# Patient Record
Sex: Male | Born: 1970 | Race: Black or African American | Hispanic: No | Marital: Married | Smoking: Current every day smoker
Health system: Southern US, Community
[De-identification: ages and names within clinical notes are randomized; demographics above are authoritative.]

---

## 2002-06-01 ENCOUNTER — Emergency Department (HOSPITAL_COMMUNITY): Admission: EM | Admit: 2002-06-01 | Discharge: 2002-06-01 | Payer: Self-pay | Admitting: Emergency Medicine

## 2003-07-29 ENCOUNTER — Emergency Department (HOSPITAL_COMMUNITY): Admission: EM | Admit: 2003-07-29 | Discharge: 2003-07-29 | Payer: Self-pay | Admitting: Emergency Medicine

## 2004-09-06 IMAGING — CR DG CHEST 2V
2 series · 2 of 2 positions shown · non-contrast
Comparison: none

CLINICAL DATA: Chest pain. 
 TWO VIEW CHEST
 No priors for comparison. 
 The heart size and mediastinal contours are normal. The lungs are clear. The visualized skeleton is unremarkable.

 IMPRESSION
 No active disease.

[view not recorded (1 of 2)]
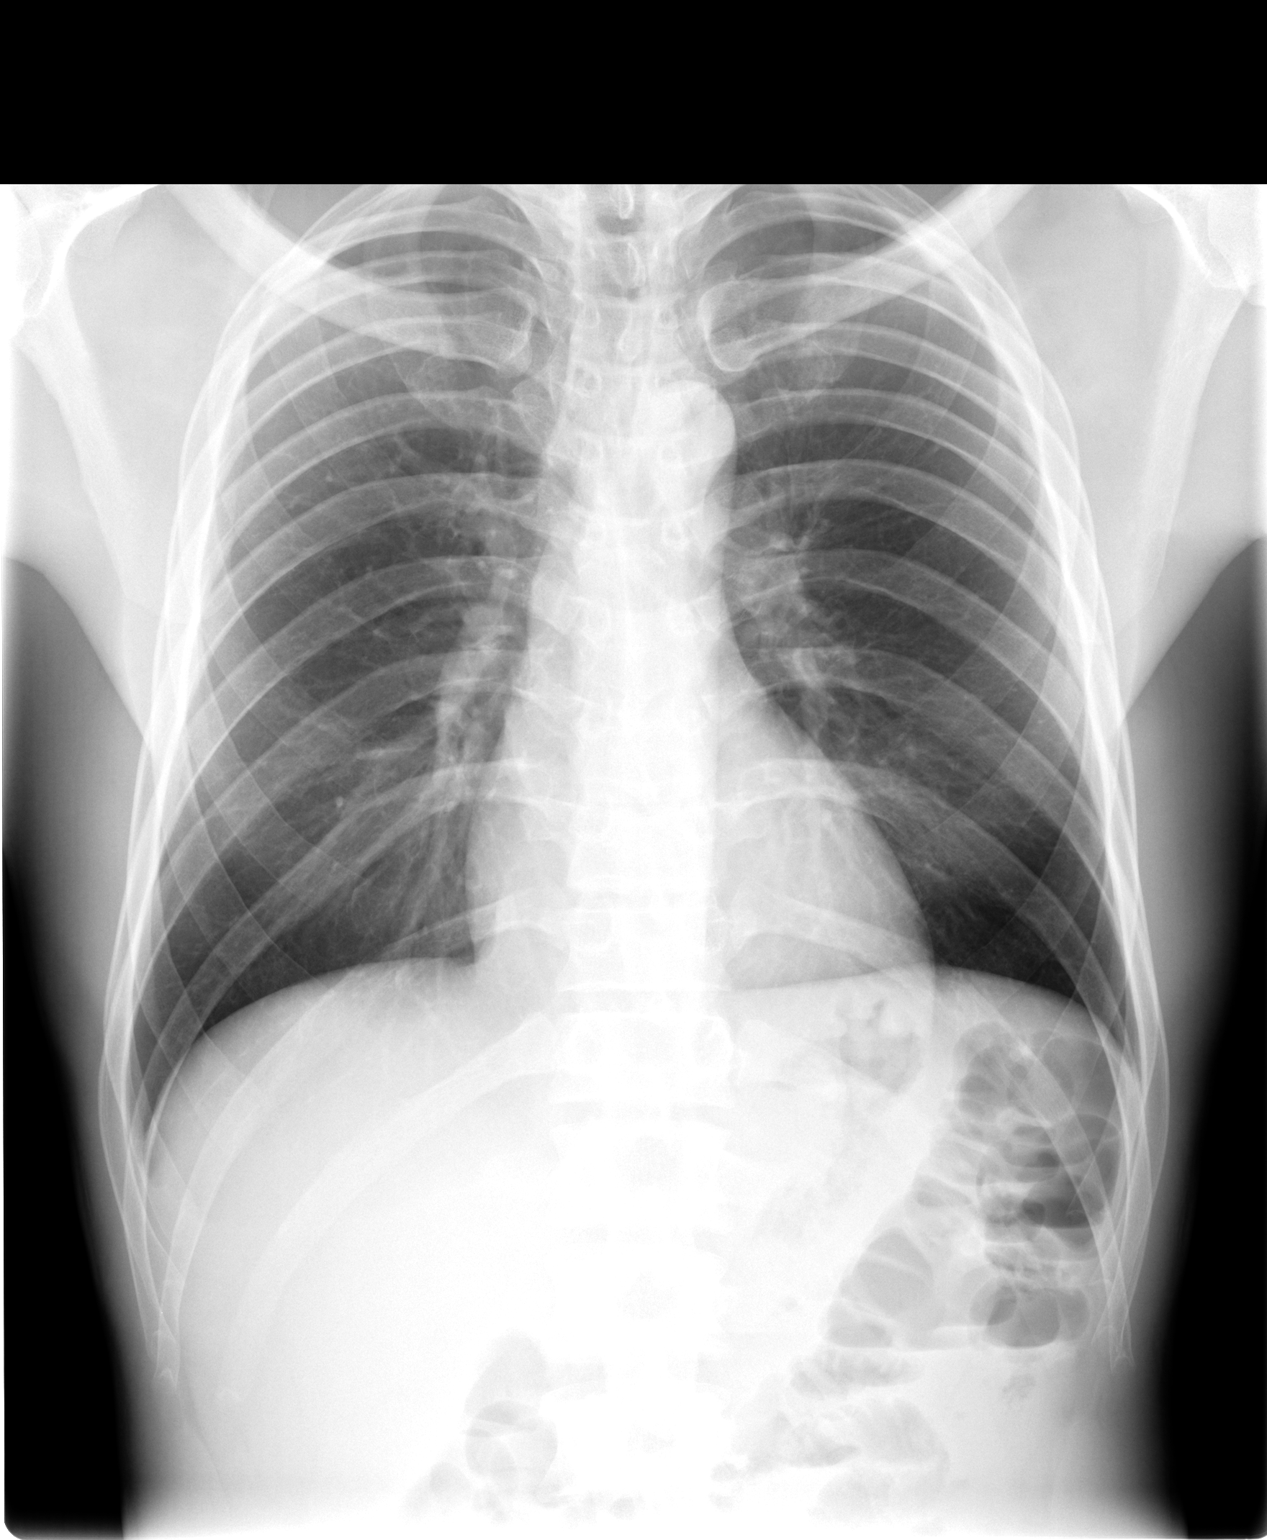

[view not recorded (2 of 2)]
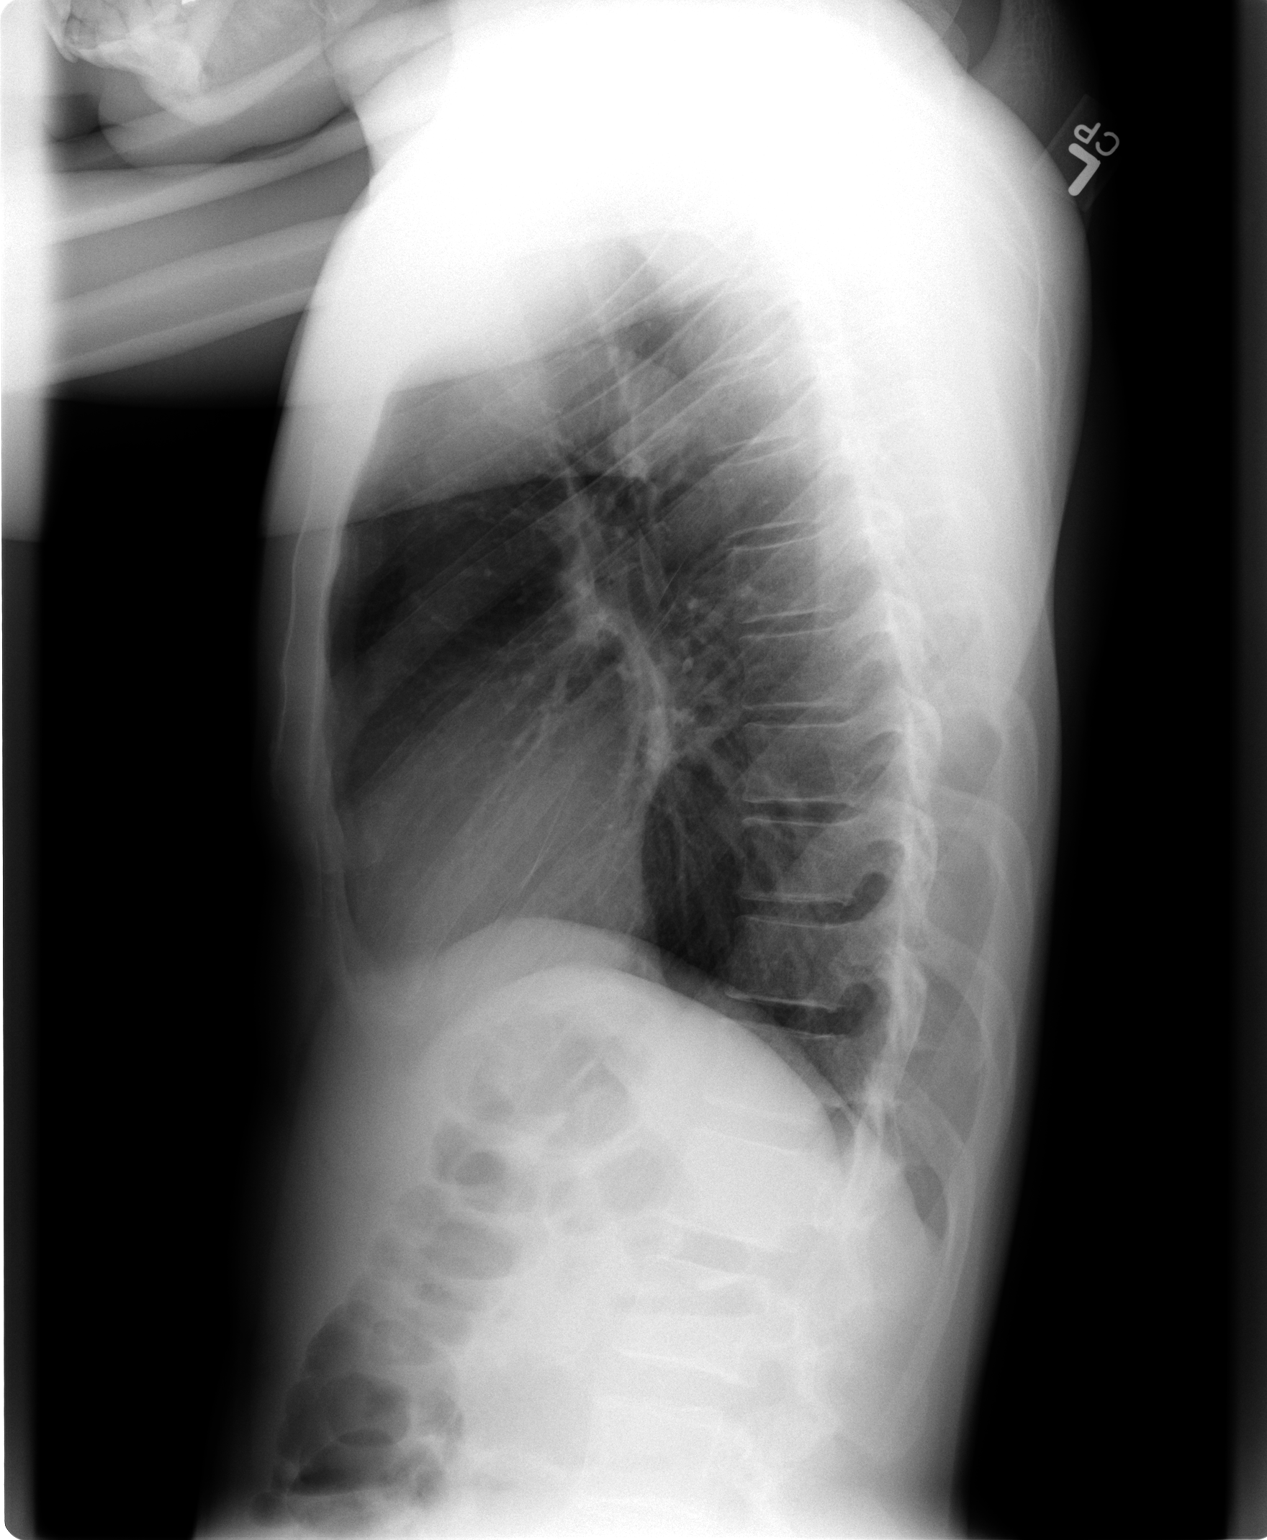

[2 of 2 positions shown; findings below may reference images not displayed]

## 2019-01-28 ENCOUNTER — Telehealth: Payer: Self-pay | Admitting: *Deleted

## 2019-01-28 ENCOUNTER — Encounter (HOSPITAL_COMMUNITY): Payer: Self-pay | Admitting: Emergency Medicine

## 2019-01-28 ENCOUNTER — Emergency Department (HOSPITAL_COMMUNITY)
Admission: EM | Admit: 2019-01-28 | Discharge: 2019-01-28 | Disposition: A | Discharge status: Home / Self Care | Payer: BC Managed Care – PPO | Attending: Emergency Medicine | Admitting: Emergency Medicine

## 2019-01-28 ENCOUNTER — Other Ambulatory Visit: Payer: Self-pay

## 2019-01-28 ENCOUNTER — Emergency Department (HOSPITAL_COMMUNITY): Payer: BC Managed Care – PPO

## 2019-01-28 ENCOUNTER — Encounter (INDEPENDENT_AMBULATORY_CARE_PROVIDER_SITE_OTHER): Payer: Self-pay

## 2019-01-28 ENCOUNTER — Telehealth: Payer: BC Managed Care – PPO | Admitting: Nurse Practitioner

## 2019-01-28 DIAGNOSIS — Z20828 Contact with and (suspected) exposure to other viral communicable diseases: Secondary | ICD-10-CM | POA: Insufficient documentation

## 2019-01-28 DIAGNOSIS — R52 Pain, unspecified: Secondary | ICD-10-CM | POA: Diagnosis not present

## 2019-01-28 DIAGNOSIS — F17210 Nicotine dependence, cigarettes, uncomplicated: Secondary | ICD-10-CM | POA: Diagnosis not present

## 2019-01-28 DIAGNOSIS — R5383 Other fatigue: Secondary | ICD-10-CM | POA: Insufficient documentation

## 2019-01-28 DIAGNOSIS — R42 Dizziness and giddiness: Secondary | ICD-10-CM

## 2019-01-28 DIAGNOSIS — Z20822 Contact with and (suspected) exposure to covid-19: Secondary | ICD-10-CM

## 2019-01-28 LAB — CBC WITH DIFFERENTIAL/PLATELET
Abs Immature Granulocytes: 0.03 10*3/uL (ref 0.00–0.07)
Basophils Absolute: 0 10*3/uL (ref 0.0–0.1)
Basophils Relative: 0 %
Eosinophils Absolute: 0.4 10*3/uL (ref 0.0–0.5)
Eosinophils Relative: 5 %
HCT: 49.2 % (ref 39.0–52.0)
Hemoglobin: 17 g/dL (ref 13.0–17.0)
Immature Granulocytes: 0 %
Lymphocytes Relative: 32 %
Lymphs Abs: 2.8 10*3/uL (ref 0.7–4.0)
MCH: 29.5 pg (ref 26.0–34.0)
MCHC: 34.6 g/dL (ref 30.0–36.0)
MCV: 85.4 fL (ref 80.0–100.0)
Monocytes Absolute: 0.7 10*3/uL (ref 0.1–1.0)
Monocytes Relative: 8 %
Neutro Abs: 4.7 10*3/uL (ref 1.7–7.7)
Neutrophils Relative %: 55 %
Platelets: 260 10*3/uL (ref 150–400)
RBC: 5.76 MIL/uL (ref 4.22–5.81)
RDW: 13.2 % (ref 11.5–15.5)
WBC: 8.7 10*3/uL (ref 4.0–10.5)
nRBC: 0 % (ref 0.0–0.2)

## 2019-01-28 LAB — CBG MONITORING, ED: Glucose-Capillary: 94 mg/dL (ref 70–99)

## 2019-01-28 LAB — COMPREHENSIVE METABOLIC PANEL
ALT: 26 U/L (ref 0–44)
AST: 24 U/L (ref 15–41)
Albumin: 4.2 g/dL (ref 3.5–5.0)
Alkaline Phosphatase: 45 U/L (ref 38–126)
Anion gap: 10 (ref 5–15)
BUN: 10 mg/dL (ref 6–20)
CO2: 21 mmol/L — ABNORMAL LOW (ref 22–32)
Calcium: 9.4 mg/dL (ref 8.9–10.3)
Chloride: 105 mmol/L (ref 98–111)
Creatinine, Ser: 1.12 mg/dL (ref 0.61–1.24)
GFR calc Af Amer: >60 mL/min (ref >60)
GFR calc non Af Amer: >60 mL/min (ref >60)
Glucose, Bld: 93 mg/dL (ref 70–99)
Potassium: 4 mmol/L (ref 3.5–5.1)
Sodium: 136 mmol/L (ref 135–145)
Total Bilirubin: 2.2 mg/dL — ABNORMAL HIGH (ref 0.3–1.2)
Total Protein: 6.7 g/dL (ref 6.5–8.1)

## 2019-01-28 NOTE — Telephone Encounter (Signed)
Left message for pt to CB. Received BPA for worsening appetite via MyChart Covid symptom monitoring. Will also respond in [place] [facility].

## 2019-01-28 NOTE — ED Provider Notes (Signed)
[facility] EMERGENCY DEPARTMENT Provider Note   CSN: [id number] Arrival date & time: 01/28/19  1859     History   Chief Complaint Chief Complaint  Patient presents with   Muscle Aches/ Dizziness/Poor Appetite    HPI Gregorey Nabor is a 48 y.o. male.     The history is provided by the patient and medical records.     48 year old male presenting to the ED with fatigue, body aches, and some mild dizziness, onset this morning.  States he feels very tired and rundown.  He has been working 10 to 14-hour days for the past few weeks.  He did start having a cough this morning but denies any hemoptysis, chest pain, shortness of breath, or fever.  He has not had any known COVID exposures but states he does work with the public so he would not be surprised.  History reviewed. No pertinent past medical history.  There are no active problems to display for this patient.   History reviewed. No pertinent surgical history.      Home Medications    Prior to Admission medications   Not on File    Family History No family history on file.  Social History Social History   Tobacco Use   Smoking status: Current Every Day Smoker   Smokeless tobacco: Never Used  Substance Use Topics   Alcohol use: Yes    Frequency: Never   Drug use: Never     Allergies   Patient has no known allergies.   Review of Systems Review of Systems  Constitutional: Positive for fatigue.  Respiratory: Positive for cough.   Neurological: Positive for dizziness.  All other systems reviewed and are negative.    Physical Exam Updated Vital Signs BP 136/87    Pulse (!) 54    Temp 98 F (36.7 C) (Oral)    Resp 16    SpO2 99%   Physical Exam Vitals signs and nursing note reviewed.  Constitutional:      Appearance: He is well-developed.     Comments: Appears well, working on laptop   HENT:     Head: Normocephalic and atraumatic.  Eyes:     Conjunctiva/sclera:  Conjunctivae normal.     Pupils: Pupils are equal, round, and reactive to light.  Neck:     Musculoskeletal: Normal range of motion.  Cardiovascular:     Rate and Rhythm: Normal rate and regular rhythm.     Heart sounds: Normal heart sounds.  Pulmonary:     Effort: Pulmonary effort is normal.     Breath sounds: Normal breath sounds. No wheezing or rhonchi.     Comments: Lungs clear, no distress, speaking in full sentences without difficulty, O2 sats 100% on RA Abdominal:     General: Bowel sounds are normal.     Palpations: Abdomen is soft.  Musculoskeletal: Normal range of motion.  Skin:    General: Skin is warm and dry.  Neurological:     Mental Status: He is alert and oriented to person, place, and time.      ED Treatments / Results  Labs (all labs ordered are listed, but only abnormal results are displayed) Labs Reviewed  COMPREHENSIVE METABOLIC PANEL - Abnormal; Notable for the following components:      Result Value   CO2 21 (*)    Total Bilirubin 2.2 (*)    All other components within normal limits  NOVEL CORONAVIRUS, NAA (HOSP ORDER, SEND-OUT TO REF LAB; TAT 18-24 HRS)  CBC WITH DIFFERENTIAL/PLATELET  CBG MONITORING, ED    EKG None  Radiology Dg Chest Port 1 View  Result Date: 01/28/2019 CLINICAL DATA:  Cough, body aches, possible COVID-19 19 EXAM: PORTABLE CHEST 1 VIEW COMPARISON:  None. FINDINGS: No consolidation, features of edema, pneumothorax, or effusion. Pulmonary vascularity is normally distributed. The cardiomediastinal contours are unremarkable. No acute osseous or soft tissue abnormality. Enthesopathic spur along the undersurface of the distal left clavicle. Mild widening of the left acromioclavicular joint could reflect prior shoulder injury. IMPRESSION: No acute cardiopulmonary abnormality. Asymmetric widening of the left acromioclavicular joint, correlate for history of prior injury tenderness to Electronically Signed   By: Kreg Shropshire M.D.   On:  01/28/2019 23:40    Procedures Procedures (including critical care time)  Medications Ordered in ED Medications - No data to display   Initial Impression / Assessment and Plan / ED Course  I have reviewed the triage vital signs and the nursing notes.  Pertinent labs & imaging results that were available during my care of the patient were reviewed by me and considered in my medical decision making (see chart for details).  48 year old male presenting to the ED with fatigue, body aches, and mild dizziness since this morning.  He is concerned for possible COVID.  He does work with the public so might of had an exposure but denies any known exposures.  He is afebrile and nontoxic.  He is in no acute distress.  His vitals are stable on room air.  His lungs are clear without any noted wheezes or rhonchi. He is not have any focal neurologic deficits.  EKG and screening labs from triage are reassuring.  Portable CXR is clear, does have widening of left AC joint.  Patient reports this is an old injury from years ago. Patient is hemodynamically stable here, not requiring supplemental oxygen and does not appear to need further emergent work-up or admission.  He will have outpatient COVID test sent.  He was instructed to home quarantine until results return, an additional 2 weeks if positive.  He was given copy of CDC guidelines for review.  Encouraged Tylenol for fever and/or body aches.  Follow-up with PCP.  He will return here for any new or acute changes, specifically any development of shortness of breath or chest pain.  Final Clinical Impressions(s) / ED Diagnoses   Final diagnoses:  Other fatigue  Body aches    ED Discharge Orders    None       Garlon Hatchet, PA-C 01/28/19 2348    Nira Conn, MD 01/29/19 [phone]

## 2019-01-28 NOTE — Progress Notes (Signed)
E-Visit for Corona Virus Screening   Your current symptoms could be consistent with the coronavirus. The new CDC guidelines say that if you have minor symptoms you do not need to be tested just quarantine until 72 hours with no symptoms. It is up to you wether you would like to be tested or not. Or you may need to be tested per your employer if you have any symptoms. The following will give you guidelines about being tested.   Many health care providers can now test patients at their office but not all are.  [place] has multiple testing sites. For information on our COVID testing locations and hours go to [email or web address]  Please quarantine yourself while awaiting your test results.  We are enrolling you in our MyChart Home [place] for COVID19 . Daily you will receive a questionnaire within the MyChart website. Our COVID 19 response team willl be monitoriing your responses daily.  You can go to one of the  testing sites listed below, while they are opened (see hours). You do not need a doctors order to be tested for covid.You do need to self-isolate until your results return and if positive 14 days from when your symptoms started and until you are 3 days symptom free.   Testing Locations (Monday - Friday, 8 a.m. - 3:30 p.m.) . [place]: [facility], [address], [place], [place]  . [place]: [address], [place], [place] (entrance off [employer])  . [facility]: [address] [place], [place] (across from [facility])    COVID-19 is a respiratory illness with symptoms that are similar to the flu. Symptoms are typically mild to moderate, but there have been cases of severe illness and death due to the virus. The following symptoms may appear 2-14 days after exposure: . Fever . Cough . Shortness of breath or difficulty breathing . Chills . Repeated  shaking with chills . Muscle pain . Headache . Sore throat . New loss of taste or smell . Fatigue . Congestion or runny nose . Nausea or vomiting . Diarrhea  It is vitally important that if you feel that you have an infection such as this virus or any other virus that you stay home and away from places where you may spread it to others.  You should self-quarantine for 14 days if you have symptoms that could potentially be coronavirus or have been in close contact a with a person diagnosed with COVID-19 within the last 2 weeks. You should avoid contact with people age 47 and older.   You should wear a mask or cloth face covering over your nose and mouth if you must be around other people or animals, including pets (even at home). Try to stay at least 6 feet away from other people. This will protect the people around you.  You can use medication such as delsym or mucinex if develop a constant cough  You may also take acetaminophen (Tylenol) as needed for fever.   Reduce your risk of any infection by using the same precautions used for avoiding the common cold or flu:  Marland Kitchen Wash your hands often with soap and warm water for at least 20 seconds.  If soap and water are not readily available, use an alcohol-based hand sanitizer with at least 60% alcohol.  . If coughing or sneezing, cover your mouth and nose by coughing or sneezing into the elbow areas of your  shirt or coat, into a tissue or into your sleeve (not your hands). . Avoid shaking hands with others and consider head nods or verbal greetings only. . Avoid touching your eyes, nose, or mouth with unwashed hands.  . Avoid close contact with people who are sick. . Avoid places or events with large numbers of people in one location, like concerts or sporting events. . Carefully consider travel plans you have or are making. . If you are planning any travel outside or inside the [place], visit the CDC's Travelers' Health webpage for the latest health  notices. . If you have some symptoms but not all symptoms, continue to monitor at home and seek medical attention if your symptoms worsen. . If you are having a medical emergency, call 911.  HOME CARE . Only take medications as instructed by your medical team. . Drink plenty of fluids and get plenty of rest. . A steam or ultrasonic humidifier can help if you have congestion.   GET HELP RIGHT AWAY IF YOU HAVE EMERGENCY WARNING SIGNS** FOR COVID-19. If you or someone is showing any of these signs seek emergency medical care immediately. Call 911 or proceed to your closest emergency facility if: . You develop worsening high fever. . Trouble breathing . Bluish lips or face . Persistent pain or pressure in the chest . New confusion . Inability to wake or stay awake . You cough up blood. . Your symptoms become more severe  **This list is not all possible symptoms. Contact your medical provider for any symptoms that are sever or concerning to you.   MAKE SURE YOU   Understand these instructions.  Will watch your condition.  Will get help right away if you are not doing well or get worse.  Your e-visit answers were reviewed by a board certified advanced clinical practitioner to complete your personal care plan.  Depending on the condition, your plan could have included both over the counter or prescription medications.  If there is a problem please reply once you have received a response from your provider.  Your safety is important to [place].  If you have drug allergies check your prescription carefully.    You can use MyChart to ask questions about today's visit, request a non-urgent call back, or ask for a work or school excuse for 24 hours related to this e-Visit. If it has been greater than 24 hours you will need to follow up with your provider, or enter a new e-Visit to address those concerns. You will get an e-mail in the next two days asking about your experience.  I hope that your  e-visit has been valuable and will speed your recovery. Thank you for using e-visits.   5-10 minutes spent reviewing and documenting in chart.

## 2019-01-28 NOTE — Discharge Instructions (Signed)
COVID test should return within the next 24 hours or so.  You will be contacted with results, they will also be uploaded into your MyChart. Recommend tylenol for fever and/or body aches.  Try to rest and stay hydrated. You will need to quarantine until results for COVID test are back, and additional 2 weeks if positive.  See full CDC guidelines below. Return here for any new/acute changes, specifically any chest pain, coughing up blood, shortness of breath, etc.     Person Under Monitoring Name: Ronald Moyer  Location: [address] [place] [address]   Infection Prevention Recommendations for Individuals Confirmed to have, or Being Evaluated for, 2019 Novel Coronavirus (COVID-19) Infection Who Receive Care at Home  Individuals who are confirmed to have, or are being evaluated for, COVID-19 should follow the prevention steps below until a healthcare provider or local or state health department says they can return to normal activities.  Stay home except to get medical care You should restrict activities outside your home, except for getting medical care. Do not go to work, school, or public areas, and do not use public transportation or taxis.  Call ahead before visiting your doctor Before your medical appointment, call the healthcare provider and tell them that you have, or are being evaluated for, COVID-19 infection. This will help the healthcare providers office take steps to keep other people from getting infected. Ask your healthcare provider to call the local or state health department.  Monitor your symptoms Seek prompt medical attention if your illness is worsening (e.g., difficulty breathing). Before going to your medical appointment, call the healthcare provider and tell them that you have, or are being evaluated for, COVID-19 infection. Ask your healthcare provider to call the local or state health department.  Wear a facemask You should wear a facemask  that covers your nose and mouth when you are in the same room with other people and when you visit a healthcare provider. People who live with or visit you should also wear a facemask while they are in the same room with you.  Separate yourself from other people in your home As much as possible, you should stay in a different room from other people in your home. Also, you should use a separate bathroom, if available.  Avoid sharing household items You should not share dishes, drinking glasses, cups, eating utensils, towels, bedding, or other items with other people in your home. After using these items, you should wash them thoroughly with soap and water.  Cover your coughs and sneezes Cover your mouth and nose with a tissue when you cough or sneeze, or you can cough or sneeze into your sleeve. Throw used tissues in a lined trash can, and immediately wash your hands with soap and water for at least 20 seconds or use an alcohol-based hand rub.  Wash your [facility] your hands often and thoroughly with soap and water for at least 20 seconds. You can use an alcohol-based hand sanitizer if soap and water are not available and if your hands are not visibly dirty. Avoid touching your eyes, nose, and mouth with unwashed hands.   Prevention Steps for Caregivers and Household Members of Individuals Confirmed to have, or Being Evaluated for, COVID-19 Infection Being Cared for in the Home  If you live with, or provide care at home for, a person confirmed to have, or being evaluated for, COVID-19 infection please follow these guidelines to prevent infection:  Follow healthcare providers instructions  Make sure that you understand and can help the patient follow any healthcare provider instructions for all care.  Provide for the patients basic needs You should help the patient with basic needs in the home and provide support for getting groceries, prescriptions, and other personal  needs.  Monitor the patients symptoms If they are getting sicker, call his or her medical provider and tell them that the patient has, or is being evaluated for, COVID-19 infection. This will help the healthcare providers office take steps to keep other people from getting infected. Ask the healthcare provider to call the local or state health department.  Limit the number of people who have contact with the patient If possible, have only one caregiver for the patient. Other household members should stay in another home or place of residence. If this is not possible, they should stay in another room, or be separated from the patient as much as possible. Use a separate bathroom, if available. Restrict visitors who do not have an essential need to be in the home.  Keep older adults, very young children, and other sick people away from the patient Keep older adults, very young children, and those who have compromised immune systems or chronic health conditions away from the patient. This includes people with chronic heart, lung, or kidney conditions, diabetes, and cancer.  Ensure good ventilation Make sure that shared spaces in the home have good air flow, such as from an air conditioner or an opened window, weather permitting.  Wash your hands often Wash your hands often and thoroughly with soap and water for at least 20 seconds. You can use an alcohol based hand sanitizer if soap and water are not available and if your hands are not visibly dirty. Avoid touching your eyes, nose, and mouth with unwashed hands. Use disposable paper towels to dry your hands. If not available, use dedicated cloth towels and replace them when they become wet.  Wear a facemask and gloves Wear a disposable facemask at all times in the room and gloves when you touch or have contact with the patients blood, body fluids, and/or secretions or excretions, such as sweat, saliva, sputum, nasal mucus, vomit, urine, or  feces.  Ensure the mask fits over your nose and mouth tightly, and do not touch it during use. Throw out disposable facemasks and gloves after using them. Do not reuse. Wash your hands immediately after removing your facemask and gloves. If your personal clothing becomes contaminated, carefully remove clothing and launder. Wash your hands after handling contaminated clothing. Place all used disposable facemasks, gloves, and other waste in a lined container before disposing them with other household waste. Remove gloves and wash your hands immediately after handling these items.  Do not share dishes, glasses, or other household items with the patient Avoid sharing household items. You should not share dishes, drinking glasses, cups, eating utensils, towels, bedding, or other items with a patient who is confirmed to have, or being evaluated for, COVID-19 infection. After the person uses these items, you should wash them thoroughly with soap and water.  Wash laundry thoroughly Immediately remove and wash clothes or bedding that have blood, body fluids, and/or secretions or excretions, such as sweat, saliva, sputum, nasal mucus, vomit, urine, or feces, on them. Wear gloves when handling laundry from the patient. Read and follow directions on labels of laundry or clothing items and detergent. In general, wash and dry with the warmest temperatures recommended on the label.  Clean all areas the  individual has used often Clean all touchable surfaces, such as counters, tabletops, doorknobs, bathroom fixtures, toilets, phones, keyboards, tablets, and bedside tables, every day. Also, clean any surfaces that may have blood, body fluids, and/or secretions or excretions on them. Wear gloves when cleaning surfaces the patient has come in contact with. Use a diluted bleach solution (e.g., dilute bleach with 1 part bleach and 10 parts water) or a household disinfectant with a label that says EPA-registered for  coronaviruses. To make a bleach solution at home, add 1 tablespoon of bleach to 1 quart (4 cups) of water. For a larger supply, add  cup of bleach to 1 gallon (16 cups) of water. Read labels of cleaning products and follow recommendations provided on product labels. Labels contain instructions for safe and effective use of the cleaning product including precautions you should take when applying the product, such as wearing gloves or eye protection and making sure you have good ventilation during use of the product. Remove gloves and wash hands immediately after cleaning.  Monitor yourself for signs and symptoms of illness Caregivers and household members are considered close contacts, should monitor their health, and will be asked to limit movement outside of the home to the extent possible. Follow the monitoring steps for close contacts listed on the symptom monitoring form.   ? If you have additional questions, contact your local health department or call the epidemiologist on call at [phone] (available 24/7). ? This guidance is subject to change. For the most up-to-date guidance from [facility], please refer to their website: YouBlogs.pl

## 2019-01-28 NOTE — ED Notes (Signed)
Reviewed discharge instructions with pt, verbalized understand.

## 2019-01-28 NOTE — ED Triage Notes (Addendum)
Patient requesting Covid test reports muscle aches , poor appetite , fatigue and intermittent dizziness onset today . Denies fever or chills/ no cough or SOB .

## 2019-01-29 ENCOUNTER — Encounter (INDEPENDENT_AMBULATORY_CARE_PROVIDER_SITE_OTHER): Payer: Self-pay

## 2019-01-30 ENCOUNTER — Encounter (INDEPENDENT_AMBULATORY_CARE_PROVIDER_SITE_OTHER): Payer: Self-pay

## 2019-01-30 LAB — NOVEL CORONAVIRUS, NAA (HOSP ORDER, SEND-OUT TO REF LAB; TAT 18-24 HRS): SARS-CoV-2, NAA: NOT DETECTED

## 2019-01-31 ENCOUNTER — Encounter (INDEPENDENT_AMBULATORY_CARE_PROVIDER_SITE_OTHER): Payer: Self-pay

## 2019-02-01 ENCOUNTER — Encounter (INDEPENDENT_AMBULATORY_CARE_PROVIDER_SITE_OTHER): Payer: Self-pay

## 2019-02-02 ENCOUNTER — Encounter (INDEPENDENT_AMBULATORY_CARE_PROVIDER_SITE_OTHER): Payer: Self-pay

## 2019-02-03 ENCOUNTER — Encounter (INDEPENDENT_AMBULATORY_CARE_PROVIDER_SITE_OTHER): Payer: Self-pay

## 2019-02-04 ENCOUNTER — Encounter (INDEPENDENT_AMBULATORY_CARE_PROVIDER_SITE_OTHER): Payer: Self-pay

## 2019-02-05 ENCOUNTER — Encounter (INDEPENDENT_AMBULATORY_CARE_PROVIDER_SITE_OTHER): Payer: Self-pay

## 2019-02-06 ENCOUNTER — Encounter (INDEPENDENT_AMBULATORY_CARE_PROVIDER_SITE_OTHER): Payer: Self-pay

## 2019-02-09 ENCOUNTER — Encounter (INDEPENDENT_AMBULATORY_CARE_PROVIDER_SITE_OTHER): Payer: Self-pay

## 2019-02-10 ENCOUNTER — Encounter (INDEPENDENT_AMBULATORY_CARE_PROVIDER_SITE_OTHER): Payer: Self-pay

## 2019-08-29 ENCOUNTER — Telehealth: Payer: BC Managed Care – PPO | Admitting: Nurse Practitioner

## 2019-08-29 DIAGNOSIS — J00 Acute nasopharyngitis [common cold]: Secondary | ICD-10-CM

## 2019-08-29 MED ORDER — FLUTICASONE PROPIONATE 50 MCG/ACT NA SUSP: 2.0000 | Freq: Every day | NASAL | 6 refills | Status: AC

## 2019-08-29 MED ORDER — BENZONATATE 100 MG PO CAPS: 100.0000 mg | ORAL_CAPSULE | Freq: Three times a day (TID) | ORAL | 0 refills | Status: DC | PRN

## 2019-08-29 NOTE — Progress Notes (Signed)

## 2020-03-30 ENCOUNTER — Encounter (HOSPITAL_COMMUNITY): Payer: Self-pay | Admitting: Emergency Medicine

## 2020-03-30 ENCOUNTER — Emergency Department (HOSPITAL_COMMUNITY)
Admission: EM | Admit: 2020-03-30 | Discharge: 2020-03-30 | Disposition: A | Discharge status: Home / Self Care | Payer: BC Managed Care – PPO | Attending: Emergency Medicine | Admitting: Emergency Medicine

## 2020-03-30 ENCOUNTER — Emergency Department (HOSPITAL_COMMUNITY): Payer: BC Managed Care – PPO

## 2020-03-30 DIAGNOSIS — J069 Acute upper respiratory infection, unspecified: Secondary | ICD-10-CM | POA: Diagnosis not present

## 2020-03-30 DIAGNOSIS — R0981 Nasal congestion: Secondary | ICD-10-CM | POA: Diagnosis present

## 2020-03-30 DIAGNOSIS — Z23 Encounter for immunization: Secondary | ICD-10-CM | POA: Insufficient documentation

## 2020-03-30 DIAGNOSIS — F17200 Nicotine dependence, unspecified, uncomplicated: Secondary | ICD-10-CM | POA: Insufficient documentation

## 2020-03-30 DIAGNOSIS — Z20822 Contact with and (suspected) exposure to covid-19: Secondary | ICD-10-CM | POA: Insufficient documentation

## 2020-03-30 LAB — RESP PANEL BY RT-PCR (FLU A&B, COVID) ARPGX2
Influenza A by PCR: NEGATIVE
Influenza B by PCR: NEGATIVE
SARS Coronavirus 2 by RT PCR: NEGATIVE

## 2020-03-30 MED ORDER — CETIRIZINE HCL 10 MG PO TABS
10.0000 mg | ORAL_TABLET | Freq: Every day | ORAL | 0 refills | Status: AC
Start: 2020-03-30 — End: ?

## 2020-03-30 MED ORDER — BENZONATATE 100 MG PO CAPS: 100.0000 mg | ORAL_CAPSULE | Freq: Three times a day (TID) | ORAL | 0 refills | Status: AC | PRN

## 2020-03-30 MED ORDER — BENZONATATE 100 MG PO CAPS
200.0000 mg | ORAL_CAPSULE | Freq: Once | ORAL | Status: AC
  Administered 2020-03-30: 200 mg via ORAL
  Filled 2020-03-30: qty 2

## 2020-03-30 MED ORDER — PROMETHAZINE-DM 6.25-15 MG/5ML PO SYRP: 5.0000 mL | ORAL_SOLUTION | Freq: Four times a day (QID) | ORAL | 0 refills | Status: AC | PRN

## 2020-03-30 MED ORDER — IBUPROFEN 400 MG PO TABS
600.0000 mg | ORAL_TABLET | Freq: Once | ORAL | Status: AC
  Administered 2020-03-30: 600 mg via ORAL
  Filled 2020-03-30: qty 1

## 2020-03-30 MED ORDER — ONDANSETRON 4 MG PO TBDP: 4.0000 mg | ORAL_TABLET | Freq: Three times a day (TID) | ORAL | 0 refills | Status: AC | PRN

## 2020-03-30 NOTE — ED Triage Notes (Signed)
Pt reports productive cough, runny nose and body aches since Sunday. Had covid booster 1week ago, reports no one else in his house has been sick besides him. Denies loss of taste or smell.

## 2020-03-30 NOTE — ED Provider Notes (Signed)
[facility] EMERGENCY DEPARTMENT Provider Note   CSN: [id number] Arrival date & time: 03/30/20  1236     History Chief Complaint  Patient presents with  . Cough  . Nasal Congestion    Ronald Moyer is a 49 y.o. male.  HPI Patient is a 49 year old male with no pertinent past medical history presented today with cough, sinus congestion, runny nose, he states that his symptoms began on Sunday.  He states he has had no fevers at home he is checked his temperature multiple times it is been normal.  He has not taken any medications apart from some Flonase.  He has been vaccinated for Covid and just received his booster vaccine 2 weeks ago.  He states he has been coughing which has been mostly hacking he states he has producing yellow phlegm.  He denies severe fatigue but states he feels less energetic than usual.  Denies any shortness of breath.  Denies any chest pain apart from when he is actively coughing.     History reviewed. No pertinent past medical history.  There are no problems to display for this patient.   History reviewed. No pertinent surgical history.     No family history on file.  Social History   Tobacco Use  . Smoking status: Current Every Day Smoker  . Smokeless tobacco: Never Used  Substance Use Topics  . Alcohol use: Yes  . Drug use: Never    Home Medications Prior to Admission medications   Medication Sig Start Date End Date Taking? Authorizing Provider  benzonatate (TESSALON PERLES) 100 MG capsule Take 1 capsule (100 mg total) by mouth 3 (three) times daily as needed. 03/30/20   Gailen Shelter, PA  cetirizine (ZYRTEC ALLERGY) 10 MG tablet Take 1 tablet (10 mg total) by mouth daily. 03/30/20   Tali Coster, Rodrigo Ran, PA  fluticasone (FLONASE) 50 MCG/ACT nasal spray Place 2 sprays into both nostrils daily. 08/29/19   Daphine Deutscher Mary-Margaret, FNP  promethazine-dextromethorphan (PROMETHAZINE-DM) 6.25-15 MG/5ML syrup Take 5 mLs by mouth 4  (four) times daily as needed for cough. 03/30/20   Gailen Shelter, PA    Allergies    Patient has no known allergies.  Review of Systems   Review of Systems  Constitutional: Positive for fatigue (mild). Negative for chills and fever.  HENT: Positive for congestion and sinus pressure.   Respiratory: Positive for cough. Negative for shortness of breath.   Cardiovascular: Negative for chest pain.  Gastrointestinal: Negative for abdominal pain, nausea and vomiting.  Musculoskeletal: Negative for neck pain.    Physical Exam Updated Vital Signs BP (!) 140/99 (BP Location: Left Arm)   Pulse 84   Temp 98.3 F (36.8 C) (Oral)   Resp 20   SpO2 98%   Physical Exam Vitals and nursing note reviewed.  Constitutional:      General: He is not in acute distress.    Comments: Pleasant well-appearing 49 year old.  In no acute distress.  Sitting comfortably in bed.  Able answer questions appropriately follow commands. No increased work of breathing. Speaking in full sentences.  HENT:     Head: Normocephalic and atraumatic.     Nose: Nose normal.     Comments: Somewhat boggy appearing nasal mucosa noted no significant rhinorrhea.  Posterior pharynx with some mild cobblestoning and clear PND. Eyes:     General: No scleral icterus. Cardiovascular:     Rate and Rhythm: Normal rate and regular rhythm.     Pulses: Normal pulses.  Heart sounds: Normal heart sounds.  Pulmonary:     Effort: Pulmonary effort is normal. No respiratory distress.     Breath sounds: Normal breath sounds. No wheezing.  Abdominal:     Palpations: Abdomen is soft.     Tenderness: There is no abdominal tenderness. There is no guarding or rebound.  Musculoskeletal:     Cervical back: Normal range of motion.     Right lower leg: No edema.     Left lower leg: No edema.  Skin:    General: Skin is warm and dry.     Capillary Refill: Capillary refill takes less than 2 seconds.  Neurological:     Mental Status: He is  alert. Mental status is at baseline.  Psychiatric:        Mood and Affect: Mood normal.        Behavior: Behavior normal.     ED Results / Procedures / Treatments   Labs (all labs ordered are listed, but only abnormal results are displayed) Labs Reviewed  RESP PANEL BY RT-PCR (FLU A&B, COVID) [id number]    EKG None  Radiology DG Chest Portable 1 View  Result Date: 03/30/2020 CLINICAL DATA:  Cough. EXAM: PORTABLE CHEST 1 VIEW COMPARISON:  01/28/2019. FINDINGS: The heart size and mediastinal contours are within normal limits. Both lungs are clear. No visible pleural effusions or pneumothorax. No acute osseous abnormality. Similar widening of the left acromioclavicular joint. IMPRESSION: No acute cardiopulmonary disease. Electronically Signed   By: Feliberto Harts MD   On: 03/30/2020 14:53    Procedures Procedures (including critical care time)  Medications Ordered in ED Medications  benzonatate (TESSALON) capsule 200 mg (has no administration in time range)  ibuprofen (ADVIL) tablet 600 mg (has no administration in time range)    ED Course  I have reviewed the triage vital signs and the nursing notes.  Pertinent labs & imaging results that were available during my care of the patient were reviewed by me and considered in my medical decision making (see chart for details).    MDM Rules/Calculators/A&P                          Patient is a well-appearing 49 year old gentleman with no significant past medical history--full medical history as detailed above--presented today with cough, congestion, runny nose, postnasal drainage and some body aches since Sunday.  He has had both been vaccinated and has had the booster 2 weeks ago.  Low suspicion for Covid however patient is having symptoms consistent with Covid.  Physical exam is very reassuring.  Lungs are clear to auscultation all fields.  We will obtain chest x-ray given the productive cough and his concern for pneumonia and  history of similar.  Chest x-ray obtained and reviewed by myself.  Denies any evidence of focal infiltrate or abnormality.  I have low suspicion for pneumonia.  No pneumothorax  I agree with radiology read.  Patient made aware of results.  Will discharge with Tylenol, ibuprofen recommendations as well as benzonatate, Zyrtec, Promethazine DM was also provided a printed prescription for Zofran should he experience any nausea.  Jaecion Dempster was evaluated in Emergency Department on 03/30/2020 for the symptoms described in the history of present illness. He was evaluated in the context of the global COVID-19 pandemic, which necessitated consideration that the patient might be at risk for infection with the SARS-CoV-2 virus that causes COVID-19. Institutional protocols and algorithms that pertain to the evaluation of  patients at risk for COVID-19 are in a state of rapid change based on information released by regulatory bodies including the CDC and federal and state organizations. These policies and algorithms were followed during the patient's care in the ED.\  Final Clinical Impression(s) / ED Diagnoses Final diagnoses:  Viral URI with cough    Rx / DC Orders ED Discharge Orders         Ordered    benzonatate (TESSALON PERLES) 100 MG capsule  3 times daily PRN        03/30/20 1459    cetirizine (ZYRTEC ALLERGY) 10 MG tablet  Daily        03/30/20 1459    promethazine-dextromethorphan (PROMETHAZINE-DM) 6.25-15 MG/5ML syrup  4 times daily PRN        03/30/20 1459           Gailen Shelter, [place] 03/30/20 1503    Linwood Dibbles, MD 04/01/20 [phone]

## 2020-03-30 NOTE — Discharge Instructions (Addendum)
Viral Illness TREATMENT  Your Covid test is pending.  It should result in 2 hours.  Please make sure that your phone number is up-to-date with our system so that we can call you for a positive result.  You can also check my chart.  Your chest x-ray was normal appearing with no evidence of pneumonia.   Please use Tylenol or ibuprofen for pain.  You may use 600 mg ibuprofen every 6 hours or 1000 mg of Tylenol every 6 hours.  You may choose to alternate between the 2.  This would be most effective.  Not to exceed 4 g of Tylenol within 24 hours.  Not to exceed 3200 mg ibuprofen 24 hours.  Follow up with your primary care doctor. Treatment is directed at relieving symptoms. There is no cure. Antibiotics are not effective, because the infection is caused by a virus, not by bacteria. Treatment may include:  Increased fluid intake. Sports drinks offer valuable electrolytes, sugars, and fluids.  Breathing heated mist or steam (vaporizer or shower).  Eating chicken soup or other clear broths, and maintaining good nutrition.  Getting plenty of rest.  Using gargles or lozenges for comfort.  Increasing usage of your inhaler if you have asthma.  Return to work when your temperature has returned to normal.  Gargle warm salt water and spit it out for sore throat. Take benadryl to decrease sinus secretions. Continue to alternate between Tylenol and ibuprofen for pain and fever control.  Follow Up: Follow up with your primary care doctor in 5-7 days for recheck of ongoing symptoms.  Return to emergency department for emergent changing or worsening of symptoms.

## 2021-05-09 IMAGING — DX DG CHEST 1V PORT
1 series · 1 of 1 positions shown · non-contrast
Comparison: 01/28/2019.

CLINICAL DATA: Cough.

EXAM:
PORTABLE CHEST 1 VIEW

[chest ap]
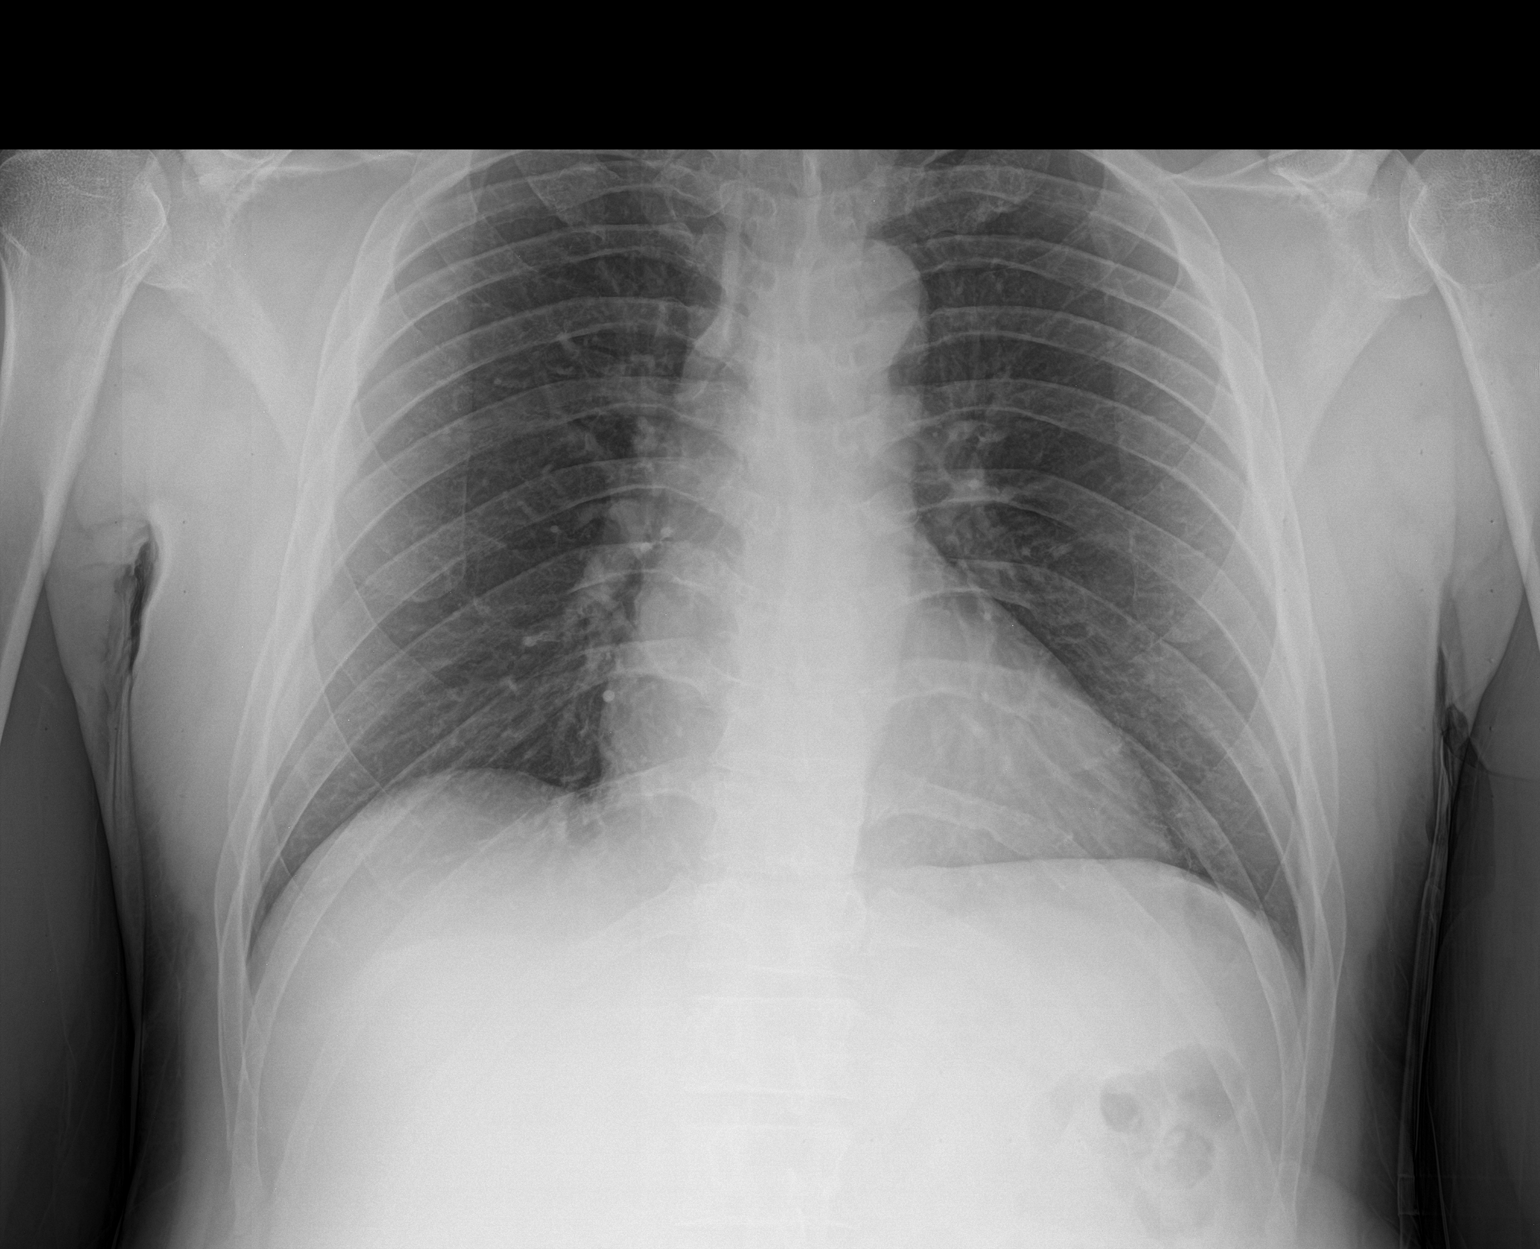

[1 of 1 positions shown; findings below may reference images not displayed]

FINDINGS: The heart size and mediastinal contours are within normal limits.
Both lungs are clear. No visible pleural effusions or pneumothorax.
No acute osseous abnormality. Similar widening of the left
acromioclavicular joint.
IMPRESSION: No acute cardiopulmonary disease.
